# Patient Record
Sex: Female | Born: 1965 | Race: White | Hispanic: No | Marital: Married | Smoking: Never smoker
Health system: Southern US, Community
[De-identification: ages and names within clinical notes are randomized; demographics above are authoritative.]

---

## 1999-10-21 ENCOUNTER — Emergency Department (HOSPITAL_COMMUNITY): Admission: EM | Admit: 1999-10-21 | Discharge: 1999-10-21 | Payer: Self-pay | Admitting: Emergency Medicine

## 1999-12-19 ENCOUNTER — Other Ambulatory Visit: Admission: RE | Admit: 1999-12-19 | Discharge: 1999-12-19 | Payer: Self-pay | Admitting: Family Medicine

## 2001-02-04 ENCOUNTER — Other Ambulatory Visit: Admission: RE | Admit: 2001-02-04 | Discharge: 2001-02-04 | Payer: Self-pay | Admitting: Family Medicine

## 2002-02-03 ENCOUNTER — Other Ambulatory Visit: Admission: RE | Admit: 2002-02-03 | Discharge: 2002-02-03 | Payer: Self-pay | Admitting: *Deleted

## 2002-11-24 HISTORY — PX: ARTHROSCOPIC REPAIR ACL: SUR80

## 2003-05-12 ENCOUNTER — Other Ambulatory Visit: Admission: RE | Admit: 2003-05-12 | Discharge: 2003-05-12 | Payer: Self-pay | Admitting: Obstetrics & Gynecology

## 2003-09-21 ENCOUNTER — Encounter: Admission: RE | Admit: 2003-09-21 | Discharge: 2003-09-21 | Payer: Self-pay | Admitting: Obstetrics & Gynecology

## 2007-04-08 ENCOUNTER — Encounter: Admission: RE | Admit: 2007-04-08 | Discharge: 2007-04-08 | Payer: Self-pay | Admitting: Family Medicine

## 2007-10-20 ENCOUNTER — Encounter: Admission: RE | Admit: 2007-10-20 | Discharge: 2007-10-20 | Payer: Self-pay | Admitting: Obstetrics & Gynecology

## 2007-10-27 ENCOUNTER — Encounter: Admission: RE | Admit: 2007-10-27 | Discharge: 2007-10-27 | Payer: Self-pay | Admitting: Obstetrics & Gynecology

## 2008-04-27 ENCOUNTER — Encounter: Admission: RE | Admit: 2008-04-27 | Discharge: 2008-04-27 | Payer: Self-pay | Admitting: Obstetrics & Gynecology

## 2008-07-20 ENCOUNTER — Encounter: Admission: RE | Admit: 2008-07-20 | Discharge: 2008-07-20 | Payer: Self-pay | Admitting: Chiropractic Medicine

## 2008-10-25 ENCOUNTER — Encounter: Admission: RE | Admit: 2008-10-25 | Discharge: 2008-10-25 | Payer: Self-pay | Admitting: Obstetrics & Gynecology

## 2009-09-01 IMAGING — CR DG LUMBAR SPINE 1V
1 series · 1 of 1 positions shown · non-contrast
Comparison: None

CLINICAL DATA: Low back pain

LUMBAR SPINE - 1 VIEW

[w l-spine lat *]
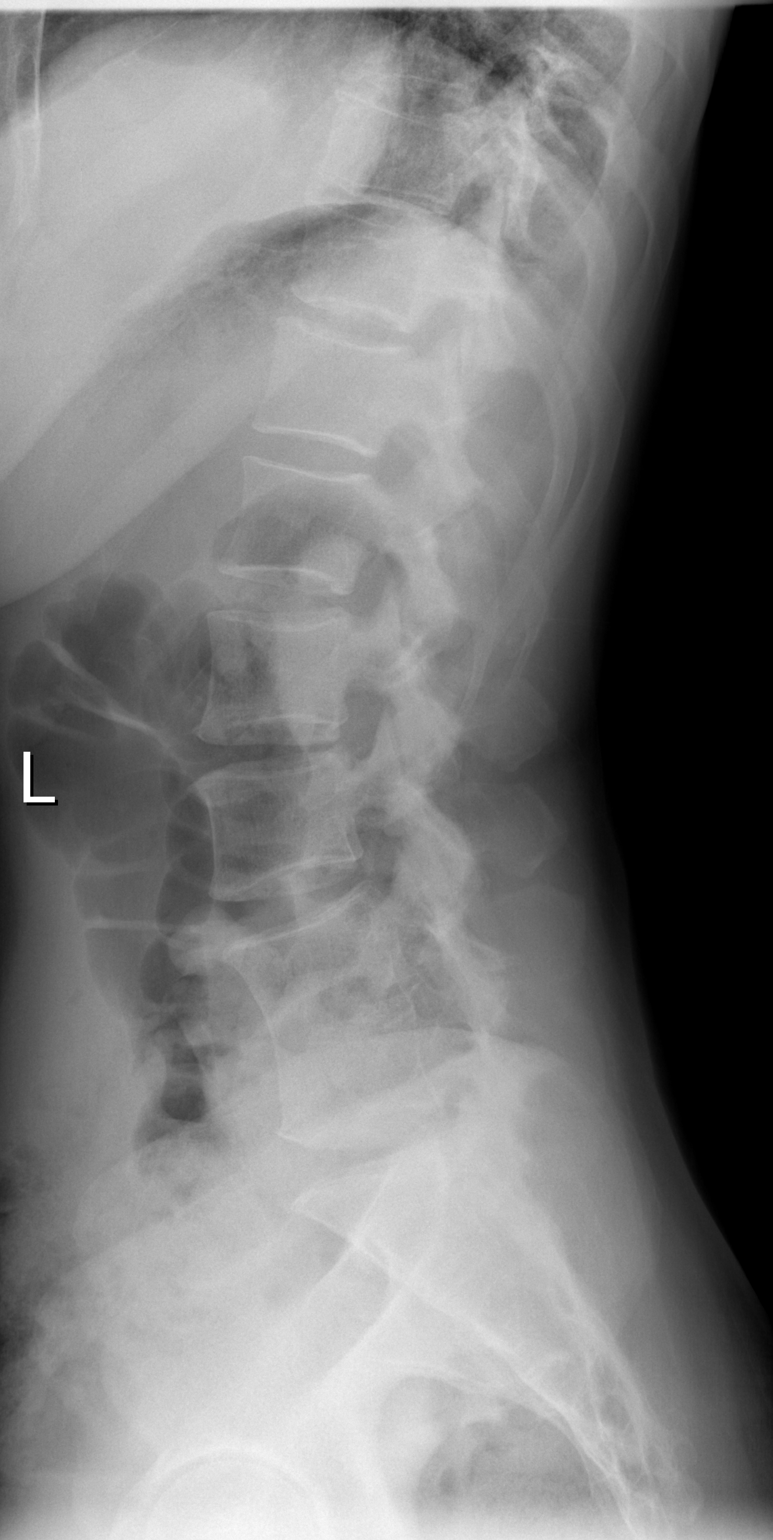

[1 of 1 positions shown; findings below may reference images not displayed]

FINDINGS: Only a single lateral projection was obtained.  There is
fusion of L4 and L5 vertebral bodies which appears to be
congenital.  Otherwise no other bony abnormalities or significant
degenerative changes identified.
IMPRESSION: Fused L4 and L5 vertebral bodies.  Likely congenital.

## 2009-09-01 IMAGING — CR DG PELVIS 1-2V
1 series · 1 of 1 positions shown · non-contrast
Comparison: None

CLINICAL DATA: Pelvic pain

PELVIS - 1-2 VIEW

[w pelvis *]
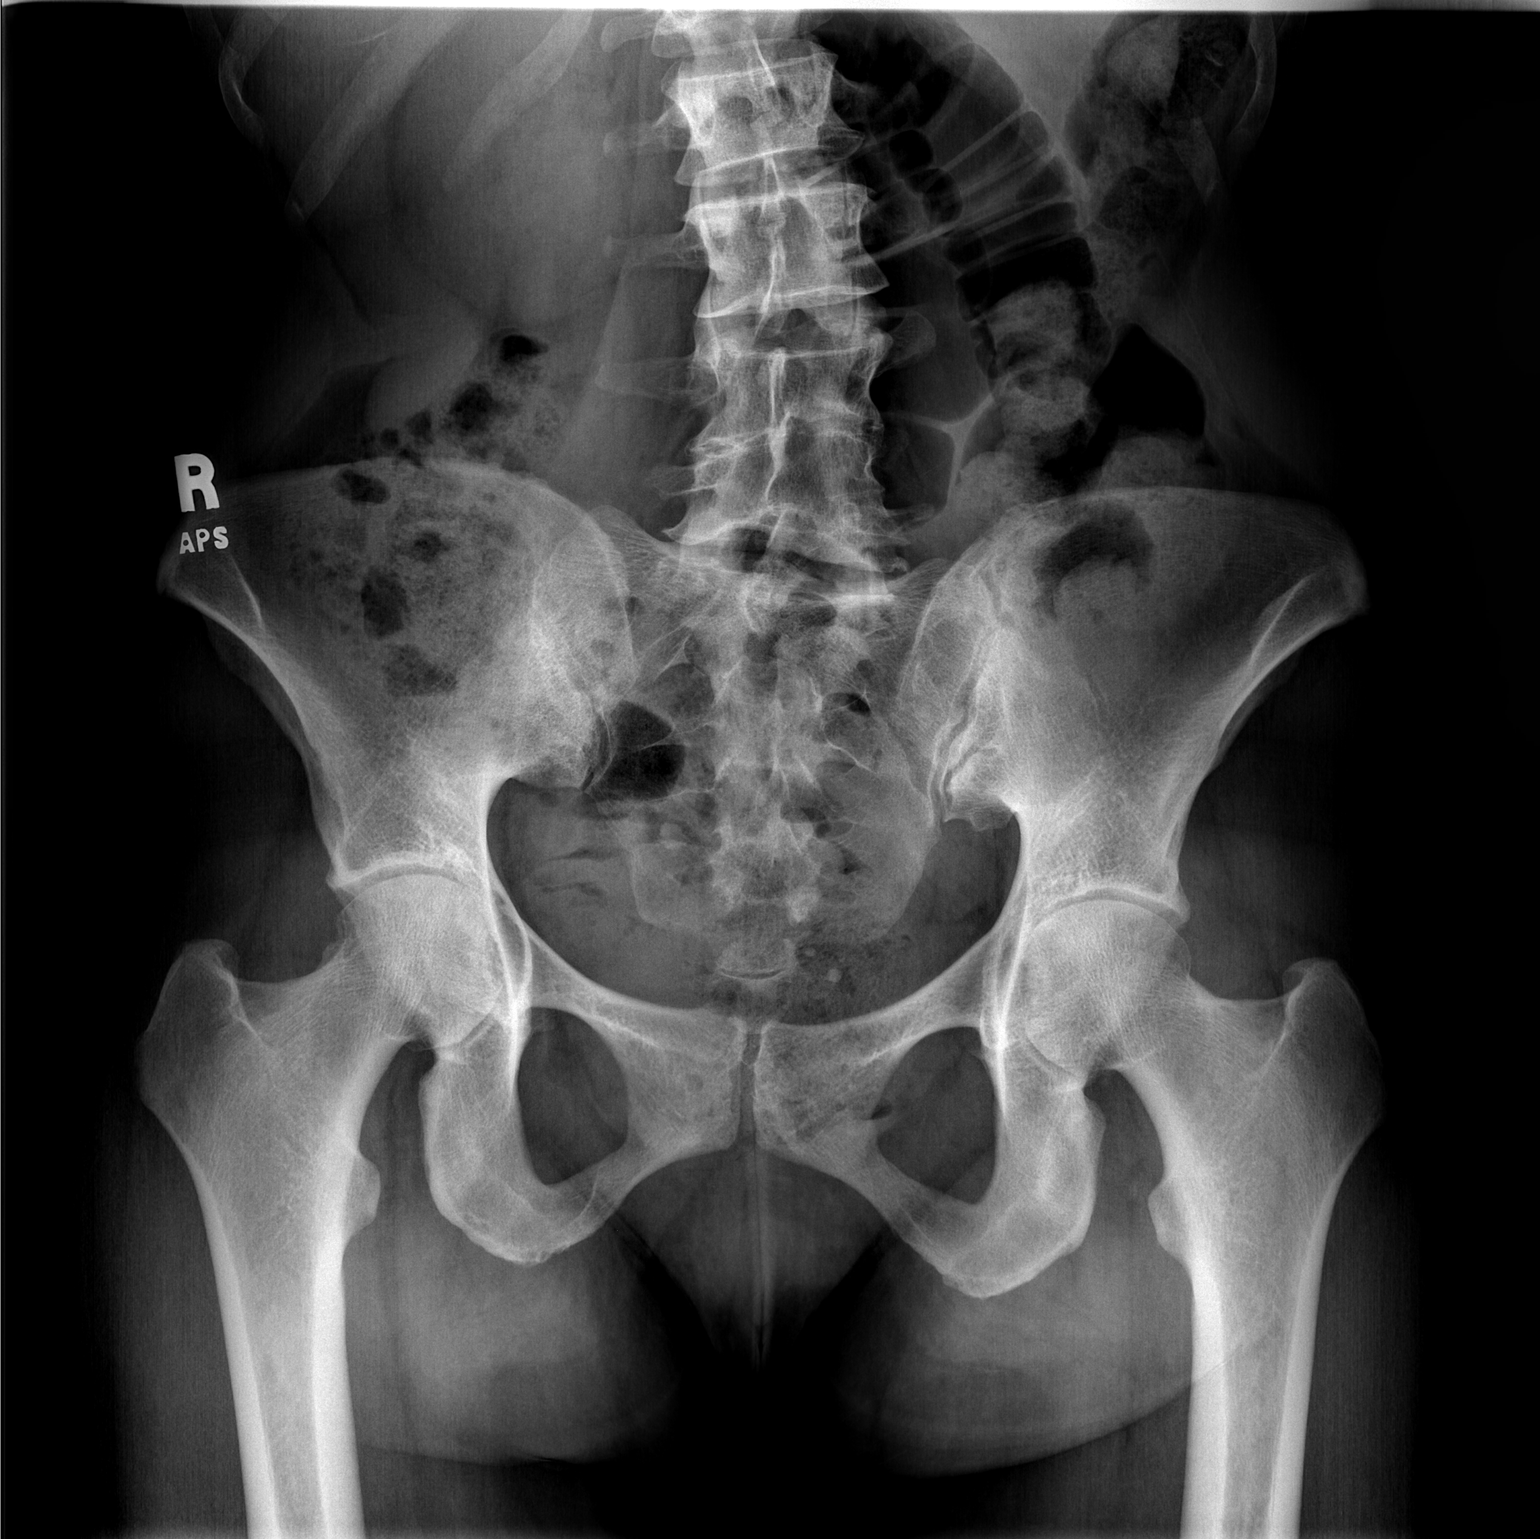

[1 of 1 positions shown; findings below may reference images not displayed]

FINDINGS: In the frontal projection there is fusion of L4-L5 as
noted on the lateral lumbar spine film.  There also is a leftward
convex scoliosis.  The rest of the bony pelvis shows no specific
abnormality.  There is mild gaseous prominence of a single left
lower small bowel loop which may be indicative of ileus.
IMPRESSION: No focal pelvic abnormality.  Levoconvex scoliosis of lumbar spine
and nonspecific gaseous dilatation of a left lower small bowel loop
which may be indicative of ileus.

## 2009-11-07 ENCOUNTER — Encounter: Admission: RE | Admit: 2009-11-07 | Discharge: 2009-11-07 | Payer: Self-pay | Admitting: Obstetrics & Gynecology

## 2011-01-15 ENCOUNTER — Other Ambulatory Visit: Payer: Self-pay | Admitting: Obstetrics & Gynecology

## 2011-01-15 DIAGNOSIS — Z1231 Encounter for screening mammogram for malignant neoplasm of breast: Secondary | ICD-10-CM

## 2011-01-21 ENCOUNTER — Ambulatory Visit
Admission: RE | Admit: 2011-01-21 | Discharge: 2011-01-21 | Disposition: A | Discharge status: Home / Self Care | Payer: 59 | Source: Ambulatory Visit | Attending: Obstetrics & Gynecology | Admitting: Obstetrics & Gynecology

## 2011-01-21 DIAGNOSIS — Z1231 Encounter for screening mammogram for malignant neoplasm of breast: Secondary | ICD-10-CM

## 2011-09-17 ENCOUNTER — Emergency Department (HOSPITAL_COMMUNITY): Payer: 59

## 2011-09-17 ENCOUNTER — Emergency Department (HOSPITAL_COMMUNITY)
Admission: EM | Admit: 2011-09-17 | Discharge: 2011-09-17 | Disposition: A | Discharge status: Home / Self Care | Payer: 59 | Attending: Emergency Medicine | Admitting: Emergency Medicine

## 2011-09-17 ENCOUNTER — Encounter (HOSPITAL_COMMUNITY): Payer: Self-pay

## 2011-09-17 DIAGNOSIS — I10 Essential (primary) hypertension: Secondary | ICD-10-CM | POA: Insufficient documentation

## 2011-09-17 DIAGNOSIS — R350 Frequency of micturition: Secondary | ICD-10-CM | POA: Insufficient documentation

## 2011-09-17 DIAGNOSIS — R5381 Other malaise: Secondary | ICD-10-CM | POA: Insufficient documentation

## 2011-09-17 DIAGNOSIS — R5383 Other fatigue: Secondary | ICD-10-CM | POA: Insufficient documentation

## 2011-09-17 DIAGNOSIS — R51 Headache: Secondary | ICD-10-CM | POA: Insufficient documentation

## 2011-09-17 LAB — URINE MICROSCOPIC-ADD ON

## 2011-09-17 LAB — URINALYSIS, ROUTINE W REFLEX MICROSCOPIC
Bilirubin Urine: NEGATIVE
Glucose, UA: NEGATIVE mg/dL
Ketones, ur: 15 mg/dL — AB
Nitrite: NEGATIVE
Protein, ur: NEGATIVE mg/dL
Specific Gravity, Urine: 1.006 (ref 1.005–1.030)
Urobilinogen, UA: 0.2 mg/dL (ref 0.0–1.0)
pH: 7 (ref 5.0–8.0)

## 2011-09-17 LAB — POCT I-STAT, CHEM 8
BUN: 10 mg/dL (ref 6–23)
Calcium, Ion: 1.16 mmol/L (ref 1.12–1.32)
Chloride: 102 mEq/L (ref 96–112)
Creatinine, Ser: 0.7 mg/dL (ref 0.50–1.10)
Glucose, Bld: 90 mg/dL (ref 70–99)
HCT: 42 % (ref 36.0–46.0)
Hemoglobin: 14.3 g/dL (ref 12.0–15.0)
Potassium: 3.6 mEq/L (ref 3.5–5.1)
Sodium: 139 mEq/L (ref 135–145)
TCO2: 24 mmol/L (ref 0–100)

## 2011-09-17 LAB — POCT PREGNANCY, URINE: Preg Test, Ur: NEGATIVE

## 2012-02-04 ENCOUNTER — Ambulatory Visit: Payer: 59

## 2012-02-04 ENCOUNTER — Ambulatory Visit (INDEPENDENT_AMBULATORY_CARE_PROVIDER_SITE_OTHER): Payer: 59 | Admitting: Emergency Medicine

## 2012-02-04 VITALS — BP 126/78 | HR 60 | Temp 98.4°F | Resp 18 | Ht 67.0 in | Wt 142.0 lb

## 2012-02-04 DIAGNOSIS — W57XXXA Bitten or stung by nonvenomous insect and other nonvenomous arthropods, initial encounter: Secondary | ICD-10-CM

## 2012-02-04 DIAGNOSIS — I10 Essential (primary) hypertension: Secondary | ICD-10-CM

## 2012-02-04 DIAGNOSIS — J479 Bronchiectasis, uncomplicated: Secondary | ICD-10-CM

## 2012-02-04 DIAGNOSIS — S40269A Insect bite (nonvenomous) of unspecified shoulder, initial encounter: Secondary | ICD-10-CM

## 2012-02-04 DIAGNOSIS — R05 Cough: Secondary | ICD-10-CM

## 2012-02-04 DIAGNOSIS — J45909 Unspecified asthma, uncomplicated: Secondary | ICD-10-CM

## 2012-02-04 DIAGNOSIS — J40 Bronchitis, not specified as acute or chronic: Secondary | ICD-10-CM

## 2012-02-04 LAB — POCT CBC
Granulocyte percent: 69.3 %G (ref 37–80)
HCT, POC: 40.4 % (ref 37.7–47.9)
Hemoglobin: 13.1 g/dL (ref 12.2–16.2)
Lymph, poc: 1.6 (ref 0.6–3.4)
MCH, POC: 28.4 pg (ref 27–31.2)
MCHC: 32.4 g/dL (ref 31.8–35.4)
MCV: 87.4 fL (ref 80–97)
MID (cbc): 0.5 (ref 0–0.9)
MPV: 9.5 fL (ref 0–99.8)
POC Granulocyte: 4.7 (ref 2–6.9)
POC LYMPH PERCENT: 22.9 %L (ref 10–50)
POC MID %: 7.8 %M (ref 0–12)
Platelet Count, POC: 234 10*3/uL (ref 142–424)
RBC: 4.62 M/uL (ref 4.04–5.48)
RDW, POC: 13.8 %
WBC: 6.8 10*3/uL (ref 4.6–10.2)

## 2012-02-04 MED ORDER — LOSARTAN POTASSIUM 50 MG PO TABS: 25.0000 mg | ORAL_TABLET | Freq: Every day | ORAL | Status: AC

## 2012-02-04 MED ORDER — BUDESONIDE-FORMOTEROL FUMARATE 80-4.5 MCG/ACT IN AERO: 2.0000 | INHALATION_SPRAY | Freq: Two times a day (BID) | RESPIRATORY_TRACT | Status: AC

## 2012-02-04 MED ORDER — ALBUTEROL SULFATE (2.5 MG/3ML) 0.083% IN NEBU
2.5000 mg | INHALATION_SOLUTION | Freq: Once | RESPIRATORY_TRACT | Status: AC
  Administered 2012-02-04: 2.5 mg via RESPIRATORY_TRACT

## 2012-02-04 MED ORDER — DOXYCYCLINE HYCLATE 100 MG PO TABS: 100.0000 mg | ORAL_TABLET | Freq: Two times a day (BID) | ORAL | Status: AC

## 2012-02-04 NOTE — Patient Instructions (Addendum)
Would advise you to stop your lotensin needed for blood pressure control. Will change it to losartan which does not have the side effect of cough.Asthma, Adult Asthma is caused by narrowing of the air passages in the lungs. It may be triggered by pollen, dust, animal dander, molds, some foods, respiratory infections, exposure to smoke, exercise, emotional stress or other allergens (things that cause allergic reactions or allergies). Repeat attacks are common. HOME CARE INSTRUCTIONS   Use prescription medications as ordered by your caregiver.   Avoid pollen, dust, animal dander, molds, smoke and other things that cause attacks at home and at work.   You may have fewer attacks if you decrease dust in your home. Electrostatic air cleaners may help.   It may help to replace your pillows or mattress with materials less likely to cause allergies.   Talk to your caregiver about an action plan for managing asthma attacks at home, including, the use of a peak flow meter which measures the severity of your asthma attack. An action plan can help minimize or stop the attack without having to seek medical care.   If you are not on a fluid restriction, drink 8 to 10 glasses of water each day.   Always have a plan prepared for seeking medical attention, including, calling your physician, accessing local emergency care, and calling 911 (in the [place] for a severe attack.   Discuss possible exercise routines with your caregiver.   If animal dander is the cause of asthma, you may need to get rid of pets.  SEEK MEDICAL CARE IF:   You have wheezing and shortness of breath even if taking medicine to prevent attacks.   You have muscle aches, chest pain or thickening of sputum.   Your sputum changes from clear or white to yellow, green, gray, or bloody.   You have any problems that may be related to the medicine you are taking (such as a rash, itching, swelling or trouble breathing).  SEEK IMMEDIATE MEDICAL  CARE IF:   Your usual medicines do not stop your wheezing or there is increased coughing and/or shortness of breath.   You have increased difficulty breathing.   You have a fever.  MAKE SURE YOU:   Understand these instructions.   Will watch your condition.   Will get help right away if you are not doing well or get worse.  Document Released: 11/10/2005 Document Revised: 10/30/2011 Document Reviewed: 06/28/2008 [facility] Patient Information 2012 [place], [place].

## 2012-02-04 NOTE — Progress Notes (Signed)
  Subjective:    Patient ID: Renee Buckley, female    DOB: 09/26/1966, 46 y.o.   MRN: [id number]  HPI patient here with a history of being ill since December. Patient apparently had a flulike illness at that time and since has been bothered with a nagging-type cough. Her cough is associated with wheezing. She has a history of allergies. She has been using her inhaler regular. It is also interesting that in December she was started on an ACE inhibitor for blood pressure control.    Review of Systems patient is having chest discomfort she feels tight in her chest she has significant wheezing. She is a nonsmoker.     Objective:   Physical Exam  Constitutional: She appears well-developed and well-nourished.  HENT:  Head: Normocephalic and atraumatic.  Eyes: Pupils are equal, round, and reactive to light. Right eye exhibits no discharge.  Neck: No JVD present. No tracheal deviation present. No thyromegaly present.  Cardiovascular: Normal rate, regular rhythm, normal heart sounds and intact distal pulses.  Exam reveals no gallop and no friction rub.   No murmur heard. Pulmonary/Chest: Effort normal. She has wheezes. She has no rales. She exhibits no tenderness.  Lymphadenopathy:    She has no cervical adenopathy.    [facility] reading (PRIMARY) by  Dr.Aedon Deason chest x-ray shows no acute disease.  Patient stated she felt a tick on her shoulder when I went back in the exam room this was cleaned with alcohol and removed with forceps patient tolerated well.     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 Some of her cough may be related to her ACE inhibitor. Plan on stopping her blood pressure pill changed to valsartan. Get a treatment check chest x-ray CBC and further management after that. She may benefit from a long-acting Advair or Symbicort.

## 2012-02-06 ENCOUNTER — Telehealth: Payer: Self-pay

## 2012-02-06 NOTE — Telephone Encounter (Signed)
Pt states that the antibodics that she was given is making her sick and would like to talk with someone

## 2012-02-06 NOTE — Telephone Encounter (Signed)
Doxy should be taken with food but not milk products (it causes it not to be absorbed).  Because of tick bite that is probably the best med for you to be one.  If still cannot tolerate it with a full stomach - let me know.

## 2012-02-06 NOTE — Telephone Encounter (Signed)
Spoke with Alfa states she has been very nauseated since she started the Doxy and Losartan.  She did not eat prior to taking Doxy but does eat prior to taking Losartan. She did not take Doxy today and is feeling better.  The directions state to take Doxy on empty stomach.  She did try to take it with milk with no improvement.  Also, she had just finished taking a Z-pack on Monday.

## 2012-02-07 NOTE — Telephone Encounter (Signed)
Left message advising that she should try taking med with full stomach and not with milk products. Also, said due to tick bite recommended doxy being best med for this. To CB if after trying with full stomach to please call us back and see what our other options could be.

## 2012-03-22 ENCOUNTER — Other Ambulatory Visit: Payer: Self-pay | Admitting: Obstetrics & Gynecology

## 2012-03-22 DIAGNOSIS — Z1231 Encounter for screening mammogram for malignant neoplasm of breast: Secondary | ICD-10-CM

## 2012-04-13 ENCOUNTER — Ambulatory Visit
Admission: RE | Admit: 2012-04-13 | Discharge: 2012-04-13 | Disposition: A | Discharge status: Home / Self Care | Payer: 59 | Source: Ambulatory Visit | Attending: Obstetrics & Gynecology | Admitting: Obstetrics & Gynecology

## 2012-04-13 DIAGNOSIS — Z1231 Encounter for screening mammogram for malignant neoplasm of breast: Secondary | ICD-10-CM

## 2012-08-12 ENCOUNTER — Other Ambulatory Visit: Payer: Self-pay | Admitting: Internal Medicine

## 2012-08-12 DIAGNOSIS — R413 Other amnesia: Secondary | ICD-10-CM

## 2012-08-13 ENCOUNTER — Ambulatory Visit
Admission: RE | Admit: 2012-08-13 | Discharge: 2012-08-13 | Disposition: A | Discharge status: Home / Self Care | Payer: 59 | Source: Ambulatory Visit | Attending: Internal Medicine | Admitting: Internal Medicine

## 2012-08-13 DIAGNOSIS — R413 Other amnesia: Secondary | ICD-10-CM

## 2012-10-29 IMAGING — CT CT HEAD W/O CM
1 of 2 series · 13 of 30 positions shown, 17 images · non-contrast
Comparison: None.

CLINICAL DATA: Hypertension.  Weakness and headache.  Blurred
vision and dizziness.

CT HEAD WITHOUT CONTRAST
TECHNIQUE: Contiguous axial images were obtained from the base of
the skull through the vertex without contrast.

[Series 2: brain · axial · 0.47mm/px · z∈[+124,+256]mm · 13 of 32 slices shown, 17 images]
[im 3/32  brain]
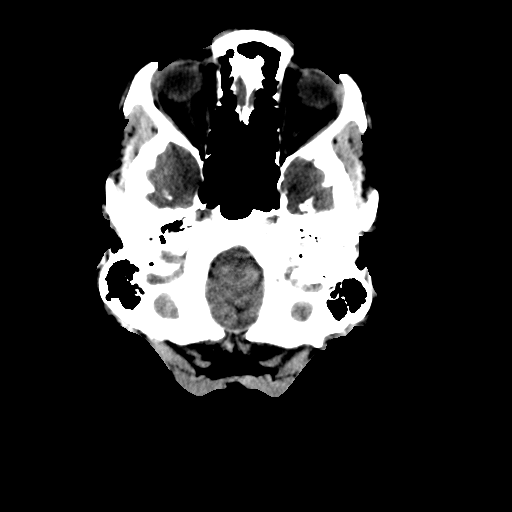
[im 3/32  bone]
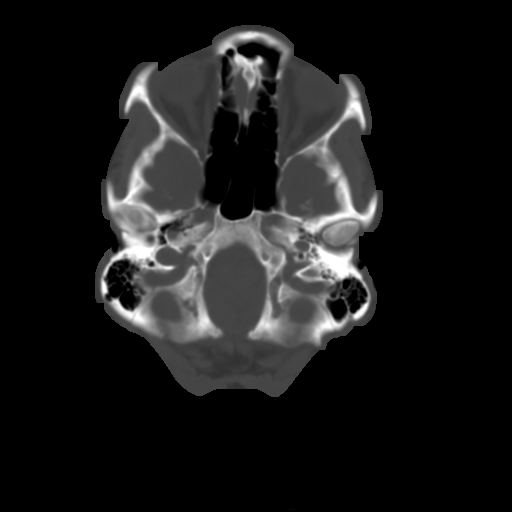
[im 5/32  brain]
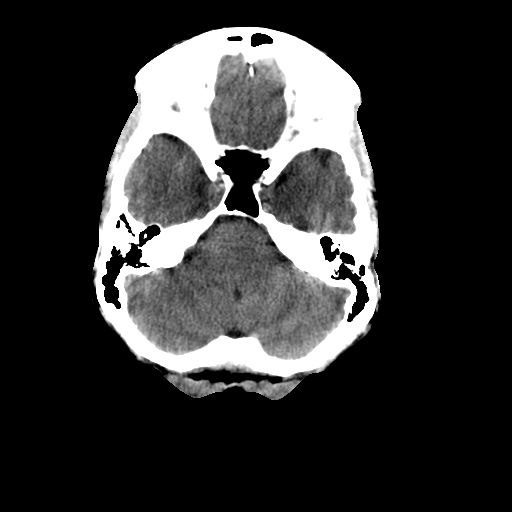
[im 7/32  brain]
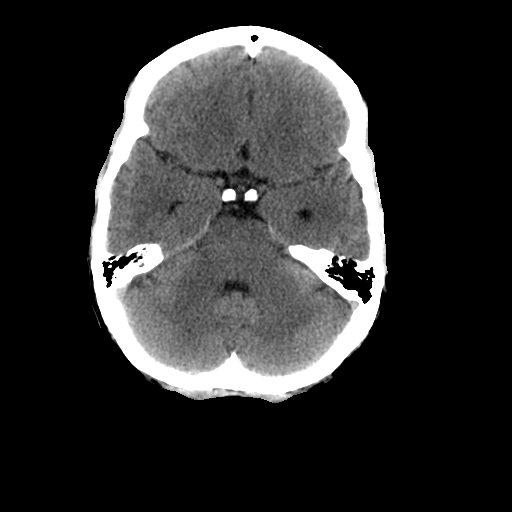
[im 9/32  brain]
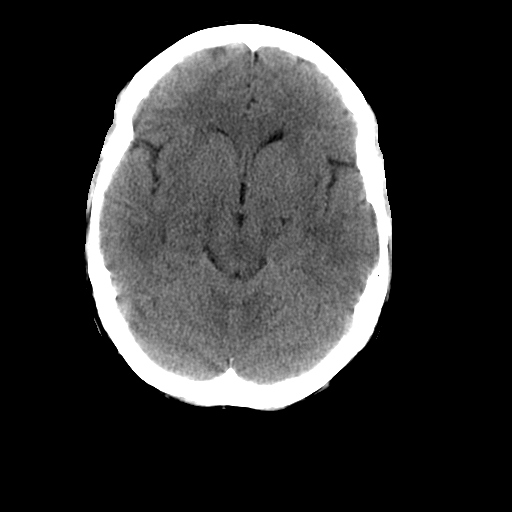
[im 12/32  brain]
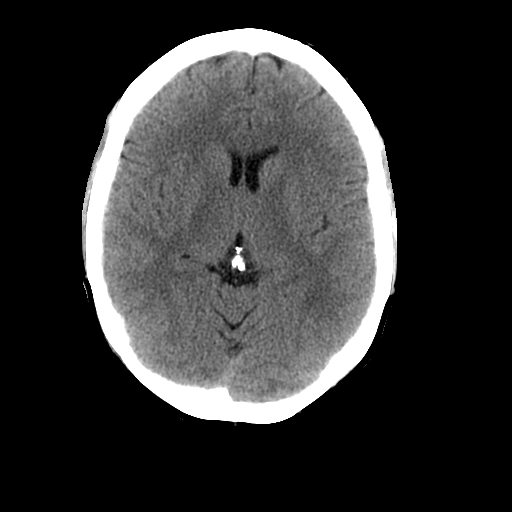
[im 12/32  bone]
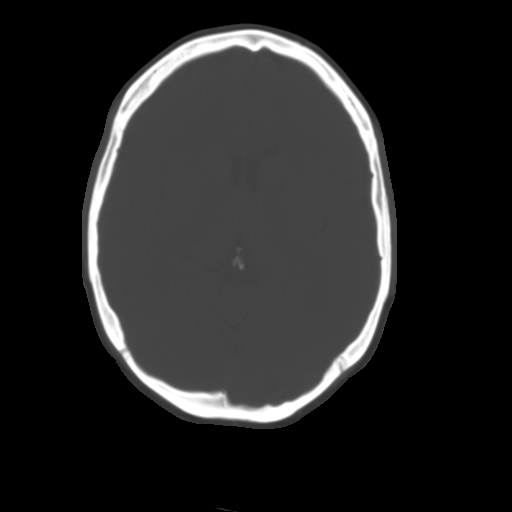
[im 14/32  brain]
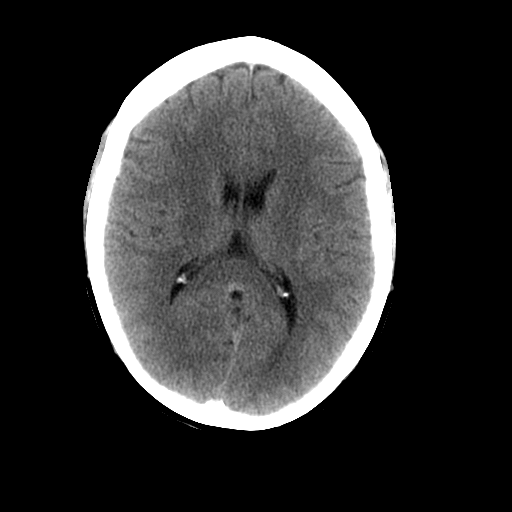
[im 16/32  brain]
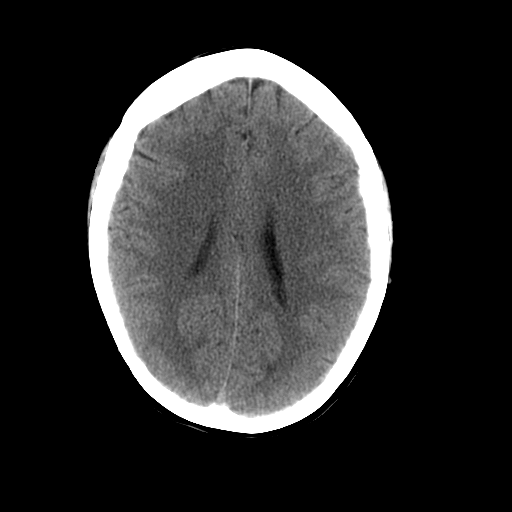
[im 18/32  brain]
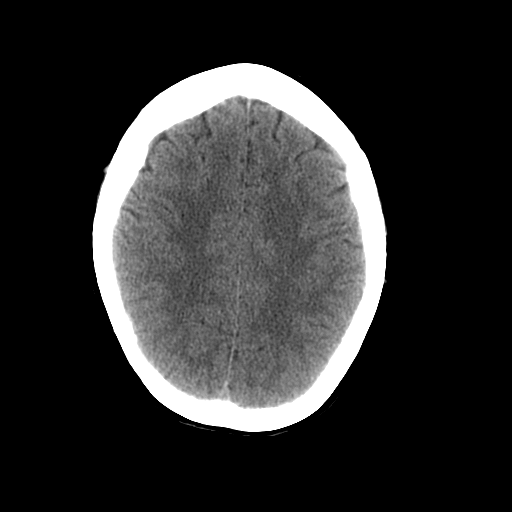
[im 20/32  brain]
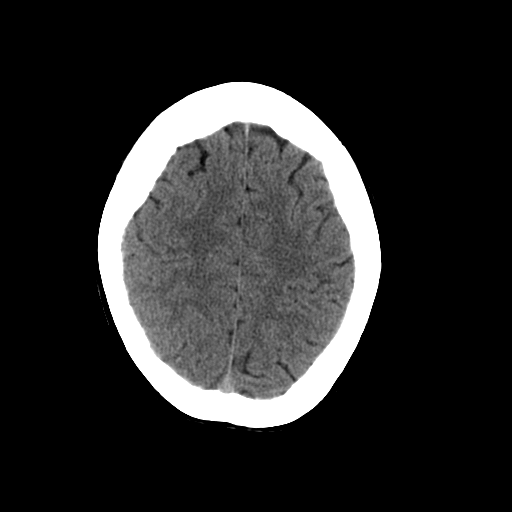
[im 20/32  bone]
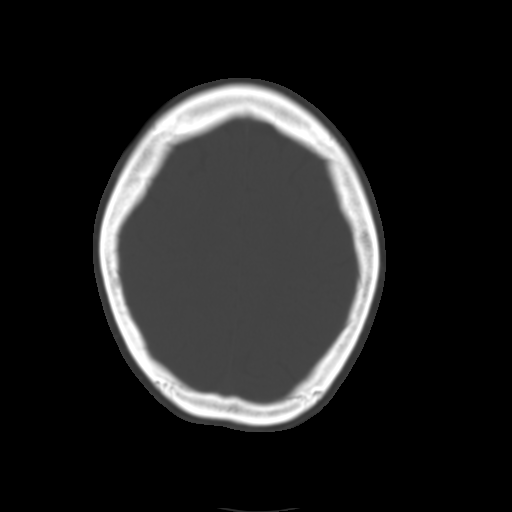
[im 23/32  brain]
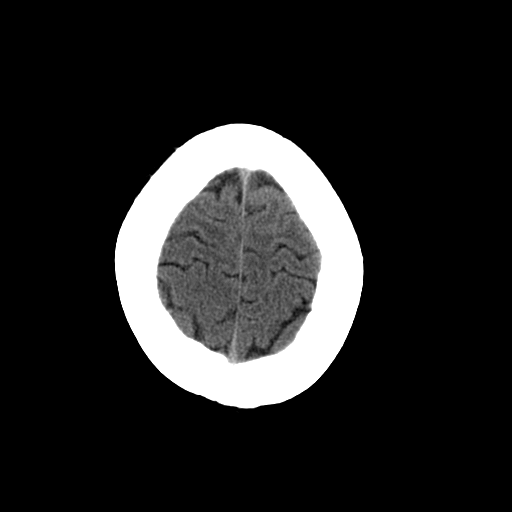
[im 25/32  brain]
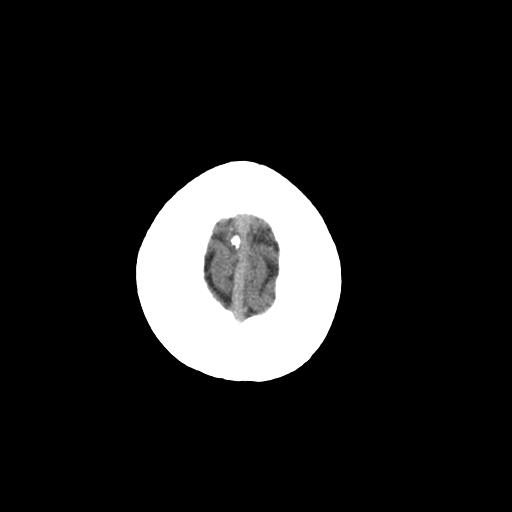
[im 27/32  brain]
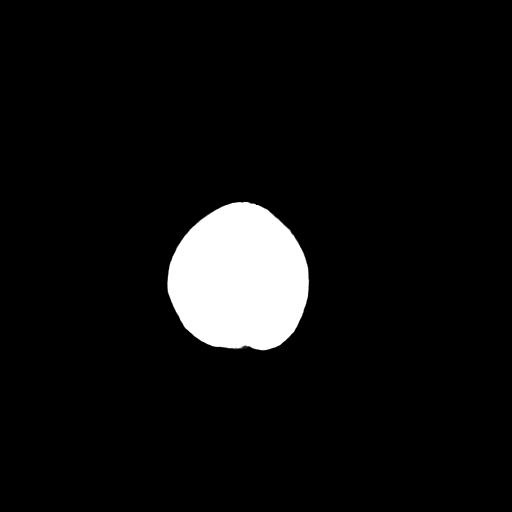
[im 29/32  brain]
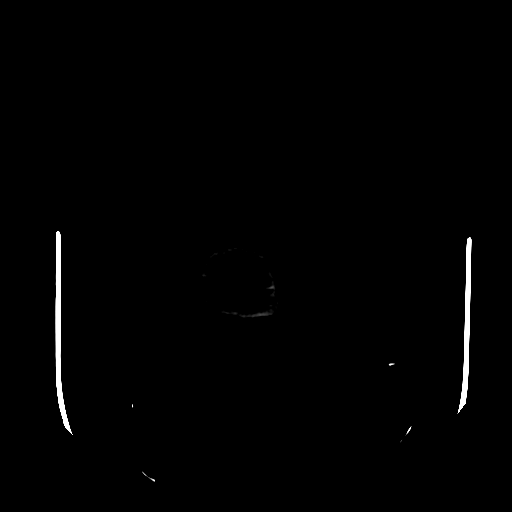
[im 29/32  bone]
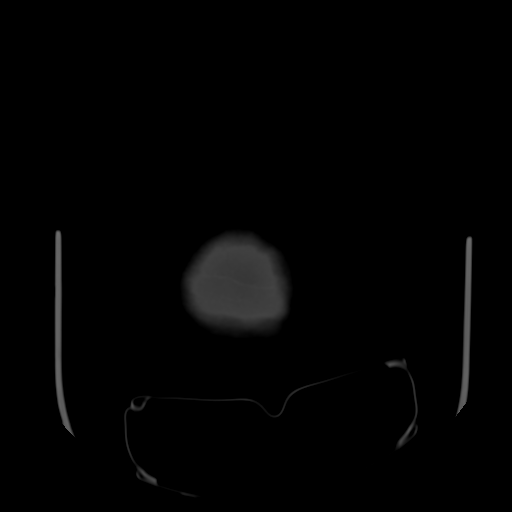

[13 of 30 positions shown; findings below may reference images not displayed]

FINDINGS: No evidence of acute infarct, acute hemorrhage, mass
lesion, mass effect or hydrocephalus.  Visualized portions of the
paranasal sinuses and mastoid air cells are clear.
IMPRESSION: Negative.

## 2013-03-28 ENCOUNTER — Other Ambulatory Visit: Payer: Self-pay

## 2013-03-28 DIAGNOSIS — Z1231 Encounter for screening mammogram for malignant neoplasm of breast: Secondary | ICD-10-CM

## 2013-05-05 ENCOUNTER — Ambulatory Visit
Admission: RE | Admit: 2013-05-05 | Discharge: 2013-05-05 | Disposition: A | Discharge status: Home / Self Care | Payer: 59 | Source: Ambulatory Visit

## 2013-05-05 DIAGNOSIS — Z1231 Encounter for screening mammogram for malignant neoplasm of breast: Secondary | ICD-10-CM

## 2013-12-07 ENCOUNTER — Other Ambulatory Visit: Payer: Self-pay | Admitting: Internal Medicine

## 2013-12-07 ENCOUNTER — Ambulatory Visit
Admission: RE | Admit: 2013-12-07 | Discharge: 2013-12-07 | Disposition: A | Discharge status: Home / Self Care | Payer: 59 | Source: Ambulatory Visit | Attending: Internal Medicine | Admitting: Internal Medicine

## 2013-12-07 DIAGNOSIS — M792 Neuralgia and neuritis, unspecified: Secondary | ICD-10-CM

## 2013-12-19 ENCOUNTER — Ambulatory Visit
Admission: RE | Admit: 2013-12-19 | Discharge: 2013-12-19 | Disposition: A | Discharge status: Home / Self Care | Payer: 59 | Source: Ambulatory Visit | Attending: Internal Medicine | Admitting: Internal Medicine

## 2013-12-19 ENCOUNTER — Other Ambulatory Visit: Payer: Self-pay | Admitting: Internal Medicine

## 2013-12-19 DIAGNOSIS — R05 Cough: Secondary | ICD-10-CM

## 2013-12-19 DIAGNOSIS — R059 Cough, unspecified: Secondary | ICD-10-CM

## 2014-09-06 ENCOUNTER — Other Ambulatory Visit: Payer: Self-pay

## 2014-09-06 DIAGNOSIS — Z1239 Encounter for other screening for malignant neoplasm of breast: Secondary | ICD-10-CM

## 2014-09-27 ENCOUNTER — Ambulatory Visit
Admission: RE | Admit: 2014-09-27 | Discharge: 2014-09-27 | Disposition: A | Discharge status: Home / Self Care | Payer: 59 | Source: Ambulatory Visit

## 2014-09-27 DIAGNOSIS — Z1239 Encounter for other screening for malignant neoplasm of breast: Secondary | ICD-10-CM

## 2015-11-29 ENCOUNTER — Other Ambulatory Visit: Payer: Self-pay

## 2015-11-29 DIAGNOSIS — Z1231 Encounter for screening mammogram for malignant neoplasm of breast: Secondary | ICD-10-CM

## 2015-12-13 ENCOUNTER — Ambulatory Visit
Admission: RE | Admit: 2015-12-13 | Discharge: 2015-12-13 | Disposition: A | Discharge status: Home / Self Care | Payer: Commercial Managed Care - HMO | Source: Ambulatory Visit

## 2015-12-13 DIAGNOSIS — Z1231 Encounter for screening mammogram for malignant neoplasm of breast: Secondary | ICD-10-CM

## 2017-03-04 ENCOUNTER — Other Ambulatory Visit: Payer: Self-pay | Admitting: Obstetrics and Gynecology

## 2017-03-04 DIAGNOSIS — Z1231 Encounter for screening mammogram for malignant neoplasm of breast: Secondary | ICD-10-CM

## 2017-03-26 ENCOUNTER — Ambulatory Visit
Admission: RE | Admit: 2017-03-26 | Discharge: 2017-03-26 | Disposition: A | Discharge status: Home / Self Care | Payer: BC Managed Care – PPO | Source: Ambulatory Visit | Attending: Obstetrics and Gynecology | Admitting: Obstetrics and Gynecology

## 2017-03-26 DIAGNOSIS — Z1231 Encounter for screening mammogram for malignant neoplasm of breast: Secondary | ICD-10-CM

## 2017-04-30 ENCOUNTER — Encounter: Payer: Self-pay | Admitting: Neurology

## 2017-04-30 ENCOUNTER — Ambulatory Visit (INDEPENDENT_AMBULATORY_CARE_PROVIDER_SITE_OTHER): Payer: BC Managed Care – PPO | Admitting: Neurology

## 2017-04-30 ENCOUNTER — Encounter (INDEPENDENT_AMBULATORY_CARE_PROVIDER_SITE_OTHER): Payer: Self-pay

## 2017-04-30 ENCOUNTER — Telehealth: Payer: Self-pay | Admitting: Neurology

## 2017-04-30 VITALS — BP 131/77 | HR 60 | Ht 68.0 in | Wt 137.8 lb

## 2017-04-30 DIAGNOSIS — H539 Unspecified visual disturbance: Secondary | ICD-10-CM | POA: Diagnosis not present

## 2017-04-30 DIAGNOSIS — H547 Unspecified visual loss: Secondary | ICD-10-CM | POA: Diagnosis not present

## 2017-04-30 DIAGNOSIS — I6349 Cerebral infarction due to embolism of other cerebral artery: Secondary | ICD-10-CM | POA: Diagnosis not present

## 2017-04-30 DIAGNOSIS — I672 Cerebral atherosclerosis: Secondary | ICD-10-CM | POA: Diagnosis not present

## 2017-04-30 DIAGNOSIS — I639 Cerebral infarction, unspecified: Secondary | ICD-10-CM | POA: Diagnosis not present

## 2017-04-30 DIAGNOSIS — I670 Dissection of cerebral arteries, nonruptured: Secondary | ICD-10-CM

## 2017-04-30 DIAGNOSIS — G43109 Migraine with aura, not intractable, without status migrainosus: Secondary | ICD-10-CM | POA: Diagnosis not present

## 2017-04-30 NOTE — Progress Notes (Signed)
[facility]    Provider:  Dr Lucia Gaskins Referring Provider: Ernesto Rutherford, MD Primary Care Physician:  Burton Apley, MD  CC:  Transient vision loss  HPI:  Renee Buckley is a 51 y.o. female here as a referral from Dr. Dione Booze for transient vision loss. PMHx HTN. She has been here for her dad's dementia. 5 years ago she was at work and all of sudden she could only see half a word. Both eyes affected. She could only see "OP STOP sign". Lasted 20 minutes, no headache, no facial droop, no weakness but difficulty getting words out. It was acute in onset and resolution. No pain. No residual effects. !.5 years ago exactly the same. 3-4 weeks ago  In [place], she was just shopping for groceries, she had a strange feeling and she couldn't see 1/2 the words. Both eyes affected symmetrically. Lasted 20-30 minutes and then completely resolved. She felt confused and anxious about her vision loss. Mother had headaches. No weakness, sensory changes. She has had white bursts in her vision like sparklers years ago. She played sports and had some concussions and neck injuries. No other focal neurologic deficits, associated symptoms, inciting events or modifiable factors. She has had headaches, pounding throbbong, nausea, light sensitivity one lasted over 12 hours but rare. She has SOB on exertion and difficulty with exertion on exercise. No other focal neurologic deficits, associated symptoms, inciting events or modifiable factors.  Reviewed notes, labs and imaging from outside physicians, which showed:  Reviewed notes from Dr. Alden Hipp. This is a patient who presented for comprehensive exam for blurring near vision. She had 3 episodes where she could only see half of the world most recent 2 weeks previous to this appointment which she only saw part of a [employer] sign. Lasted 15-20 minutes and she felt weird. No headache. First episode was 45 years ago, second episode was one and half years ago and then 2  weeks prior to appointment with Dr. Dione Booze. It was always completely lost to the left side and are always came back in about 20 minutes. Reviewed entire exam which was unremarkable including visual fields, visual acuity, external exam including pupils, orbits, Intraocular pressure, slit lamp exam with normal appearing normal appearing optic nerve with healthy pink rim and normal nerve fiber layer, symptoms sound like classical migraine.  CT head 2013 showed No acute intracranial abnormalities including mass lesion or mass effect, hydrocephalus, extra-axial fluid collection, midline shift, hemorrhage, or acute infarction, large ischemic events (personally reviewed images)     Review of Systems: Patient complains of symptoms per HPI as well as the following symptoms: Fatigue, blurred vision, loss of vision, aching muscles, allergies, runny nose, memory loss, headache, numbness, slurred speech, decreased energy. Pertinent negatives and positives per HPI. All others negative.   Social History   Social History  . Marital status: Married    Spouse name: N/A  . Number of children: 0  . Years of education: 12   Occupational History  . Self-employed    Social History Main Topics  . Smoking status: Never Smoker  . Smokeless tobacco: Former Neurosurgeon    Types: Chew    Quit date: 2003  . Alcohol use 1.2 oz/week    2 Standard drinks or equivalent per week  . Drug use: No  . Sexual activity: Not on file   Other Topics Concern  . Not on file   Social History Narrative   Lives at home w/ her husband   Right-handed  Caffeine: 3 cans per day    Family History  Problem Relation Age of Onset  . COPD Mother   . High blood pressure Mother   . Stroke Father     History reviewed. No pertinent past medical history.  Past Surgical History:  Procedure Laterality Date  . ARTHROSCOPIC REPAIR ACL  2004    Current Outpatient Prescriptions  Medication Sig Dispense Refill  . losartan (COZAAR) 100  MG tablet Take by mouth.    Renee Buckley 0.18/0.215/0.25 MG-25 MCG tab      No current facility-administered medications for this visit.     Allergies as of 04/30/2017  . (No Known Allergies)    Vitals: BP 131/77   Pulse 60   Ht 5\' 8"  (1.727 m)   Wt 137 lb 12.8 oz (62.5 kg)   BMI 20.95 kg/m  Last Weight:  Wt Readings from Last 1 Encounters:  04/30/17 137 lb 12.8 oz (62.5 kg)   Last Height:   Ht Readings from Last 1 Encounters:  04/30/17 5\' 8"  (1.727 m)   Physical exam: Exam: Gen: NAD, conversant, well nourised, well groomed                     CV: RRR, no MRG. No Carotid Bruits. No peripheral edema, warm, nontender Eyes: Conjunctivae clear without exudates or hemorrhage  Neuro: Detailed Neurologic Exam  Speech:    Speech is normal; fluent and spontaneous with normal comprehension.  Cognition:    The patient is oriented to person, place, and time;     recent and remote memory intact;     language fluent;     normal attention, concentration,     fund of knowledge Cranial Nerves:    The pupils are equal, round, and reactive to light. The fundi are normal and spontaneous venous pulsations are present. Visual fields are full to finger confrontation. Extraocular movements are intact. Trigeminal sensation is intact and the muscles of mastication are normal. The face is symmetric. The palate elevates in the midline. Hearing intact. Voice is normal. Shoulder shrug is normal. The tongue has normal motion without fasciculations.   Coordination:    Normal finger to nose and heel to shin. Normal rapid alternating movements.   Gait:    Heel-toe and tandem gait are normal.   Motor Observation:    No asymmetry, no atrophy, and no involuntary movements noted. Tone:    Normal muscle tone.    Posture:    Posture is normal. normal erect    Strength:    Strength is V/V in the upper and lower limbs.      Sensation: intact to LT     Reflex Exam:  DTR's:    Deep tendon  reflexes in the upper and lower extremities are normal bilaterally.   Toes:    The toes are downgoing bilaterally.   Clonus:    Clonus is absent.       Assessment/Plan:  51 year old lovely patient with transient vision loss, aphasia given symptoms and vascular risk factors need evaluation for ischemic stroke, embolic stroke including vessel-to-vessel emboli or cardiac emboli. Also want to ensure no dissection. Likely migraine with aura but need to rule out other etiologies as above.  MRI brain to evaluate for strokes MRA of the head to evaluate for cerebrovascular disease and artery to artery embolic etiology MRA of the neck to rule out dissection Echocardiogram to evaluate for cardiac etiologies of embolic stroke Labs, will check routine labs  as well as ldl and hgba1c as these are stroke risk factors  Cc: Dr. Dione BoozeGroat  Orders Placed This Encounter  Procedures  . MR BRAIN WO CONTRAST  . MR MRA HEAD WO CONTRAST  . MR MRA NECK W WO CONTRAST  . Comprehensive metabolic panel  . CBC  . Lipid panel  . Hemoglobin A1c    Cc: Dr. Dione BoozeGroat  To prevent or relieve headaches, try the following: Cool Compress. Lie down and place a cool compress on your head.  Avoid headache triggers. If certain foods or odors seem to have triggered your migraines in the past, avoid them. A headache diary might help you identify triggers.  Include physical activity in your daily routine. Try a daily walk or other moderate aerobic exercise.  Manage stress. Find healthy ways to cope with the stressors, such as delegating tasks on your to-do list.  Practice relaxation techniques. Try deep breathing, yoga, massage and visualization.  Eat regularly. Eating regularly scheduled meals and maintaining a healthy diet might help prevent headaches. Also, drink plenty of fluids.  Follow a regular sleep schedule. Sleep deprivation might contribute to headaches Consider biofeedback. With this mind-body technique, you learn to  control certain bodily functions - such as muscle tension, heart rate and blood pressure - to prevent headaches or reduce headache pain.    Proceed to emergency room if you experience new or worsening symptoms or symptoms do not resolve, if you have new neurologic symptoms or if headache is severe, or for any concerning symptom.   Provided education and documentation from American headache Society toolbox including articles on: chronic migraine medication overuse headache, chronic migraines, prevention of migraines, behavioral and other nonpharmacologic treatments for headache.   Naomie DeanAntonia Azuri Bozard, MD  [facility] [address] [place], [place] [id number]  Phone [phone] Fax [phone]

## 2017-04-30 NOTE — Patient Instructions (Signed)
Remember to drink plenty of fluid, eat healthy meals and do not skip any meals. Try to eat protein with a every meal and eat a healthy snack such as fruit or nuts in between meals. Try to keep a regular sleep-wake schedule and try to exercise daily, particularly in the form of walking, 20-30 minutes a day, if you can.   As far as diagnostic testing: Labs, MRI brain and also MRi of the blood vessels in the head and neck  I would like to see you back as needed, sooner if we need to. Please call us with any interim questions, concerns, problems, updates or refill requests.   Our phone number is [phone]. We also have an after hours call service for urgent matters and there is a physician on-call for urgent questions. For any emergencies you know to call 911 or go to the nearest emergency room   Migraine Headache A migraine headache is an intense, throbbing pain on one side or both sides of the head. Migraines may also cause other symptoms, such as nausea, vomiting, and sensitivity to light and noise. What are the causes? Doing or taking certain things may also trigger migraines, such as:  Alcohol.  Smoking.  Medicines, such as: ? Medicine used to treat chest pain (nitroglycerine). ? Birth control pills. ? Estrogen pills. ? Certain blood pressure medicines.  Aged cheeses, chocolate, or caffeine.  Foods or drinks that contain nitrates, glutamate, aspartame, or tyramine.  Physical activity.  Other things that may trigger a migraine include:  Menstruation.  Pregnancy.  Hunger.  Stress, lack of sleep, too much sleep, or fatigue.  Weather changes.  What increases the risk? The following factors may make you more likely to experience migraine headaches:  Age. Risk increases with age.  Family history of migraine headaches.  Being Caucasian.  Depression and anxiety.  Obesity.  Being a woman.  Having a hole in the heart (patent foramen ovale) or other heart  problems.  What are the signs or symptoms? The main symptom of this condition is pulsating or throbbing pain. Pain may:  Happen in any area of the head, such as on one side or both sides.  Interfere with daily activities.  Get worse with physical activity.  Get worse with exposure to bright lights or loud noises.  Other symptoms may include:  Nausea.  Vomiting.  Dizziness.  General sensitivity to bright lights, loud noises, or smells.  Before you get a migraine, you may get warning signs that a migraine is developing (aura). An aura may include:  Seeing flashing lights or having blind spots.  Seeing bright spots, halos, or zigzag lines.  Having tunnel vision or blurred vision.  Having numbness or a tingling feeling.  Having trouble talking.  Having muscle weakness.  How is this diagnosed? A migraine headache can be diagnosed based on:  Your symptoms.  A physical exam.  Tests, such as CT scan or MRI of the head. These imaging tests can help rule out other causes of headaches.  Taking fluid from the spine (lumbar puncture) and analyzing it (cerebrospinal fluid analysis, or CSF analysis).  How is this treated? A migraine headache is usually treated with medicines that:  Relieve pain.  Relieve nausea.  Prevent migraines from coming back.  Treatment may also include:  Acupuncture.  Lifestyle changes like avoiding foods that trigger migraines.  Follow these instructions at home: Medicines  Take over-the-counter and prescription medicines only as told by your health care provider.  Do  not drive or use heavy machinery while taking prescription pain medicine.  To prevent or treat constipation while you are taking prescription pain medicine, your health care provider may recommend that you: ? Drink enough fluid to keep your urine clear or pale yellow. ? Take over-the-counter or prescription medicines. ? Eat foods that are high in fiber, such as fresh  fruits and vegetables, whole grains, and beans. ? Limit foods that are high in fat and processed sugars, such as fried and sweet foods. Lifestyle  Avoid alcohol use.  Do not use any products that contain nicotine or tobacco, such as cigarettes and e-cigarettes. If you need help quitting, ask your health care provider.  Get at least 8 hours of sleep every night.  Limit your stress. General instructions   Keep a journal to find out what may trigger your migraine headaches. For example, write down: ? What you eat and drink. ? How much sleep you get. ? Any change to your diet or medicines.  If you have a migraine: ? Avoid things that make your symptoms worse, such as bright lights. ? It may help to lie down in a dark, quiet room. ? Do not drive or use heavy machinery. ? Ask your health care provider what activities are safe for you while you are experiencing symptoms.  Keep all follow-up visits as told by your health care provider. This is important. Contact a health care provider if:  You develop symptoms that are different or more severe than your usual migraine symptoms. Get help right away if:  Your migraine becomes severe.  You have a fever.  You have a stiff neck.  You have vision loss.  Your muscles feel weak or like you cannot control them.  You start to lose your balance often.  You develop trouble walking.  You faint. This information is not intended to replace advice given to you by your health care provider. Make sure you discuss any questions you have with your health care provider. Document Released: 11/10/2005 Document Revised: 05/30/2016 Document Reviewed: 04/28/2016 Elsevier Interactive Patient Education  2017 [employer]

## 2017-04-30 NOTE — Telephone Encounter (Signed)
The MRI brain & MRA Neck was approved but not the MRA Head wo contrast. They will not release the authorization number due to they are all linked together.. The phone number for the peer to peer is [phone] and the member ID is [id number] & DOB is 1966-09-25. The case close's on Monday 05/04/17.

## 2017-05-01 LAB — LIPID PANEL
CHOLESTEROL TOTAL: 196 mg/dL (ref 100–199)
Chol/HDL Ratio: 2.9 ratio (ref 0.0–4.4)
HDL: 68 mg/dL (ref >39)
LDL Calculated: 110 mg/dL — ABNORMAL HIGH (ref 0–99)
Triglycerides: 88 mg/dL (ref 0–149)
VLDL CHOLESTEROL CAL: 18 mg/dL (ref 5–40)

## 2017-05-01 LAB — COMPREHENSIVE METABOLIC PANEL
A/G RATIO: 1.8 (ref 1.2–2.2)
ALK PHOS: 40 IU/L (ref 39–117)
ALT: 12 IU/L (ref 0–32)
AST: 16 IU/L (ref 0–40)
Albumin: 4.3 g/dL (ref 3.5–5.5)
BUN/Creatinine Ratio: 20 (ref 9–23)
BUN: 16 mg/dL (ref 6–24)
Bilirubin Total: 0.5 mg/dL (ref 0.0–1.2)
CO2: 22 mmol/L (ref 18–29)
Calcium: 9.6 mg/dL (ref 8.7–10.2)
Chloride: 103 mmol/L (ref 96–106)
Creatinine, Ser: 0.81 mg/dL (ref 0.57–1.00)
GFR calc Af Amer: 99 mL/min/{1.73_m2} (ref >59)
GFR calc non Af Amer: 86 mL/min/{1.73_m2} (ref >59)
GLOBULIN, TOTAL: 2.4 g/dL (ref 1.5–4.5)
Glucose: 84 mg/dL (ref 65–99)
POTASSIUM: 4.9 mmol/L (ref 3.5–5.2)
SODIUM: 138 mmol/L (ref 134–144)
Total Protein: 6.7 g/dL (ref 6.0–8.5)

## 2017-05-01 LAB — CBC
Hematocrit: 40.5 % (ref 34.0–46.6)
Hemoglobin: 13.2 g/dL (ref 11.1–15.9)
MCH: 28.8 pg (ref 26.6–33.0)
MCHC: 32.6 g/dL (ref 31.5–35.7)
MCV: 88 fL (ref 79–97)
PLATELETS: 286 10*3/uL (ref 150–379)
RBC: 4.59 x10E6/uL (ref 3.77–5.28)
RDW: 13.6 % (ref 12.3–15.4)
WBC: 5.4 10*3/uL (ref 3.4–10.8)

## 2017-05-01 LAB — HEMOGLOBIN A1C
ESTIMATED AVERAGE GLUCOSE: 100 mg/dL
Hgb A1c MFr Bld: 5.1 % (ref 4.8–5.6)

## 2017-05-04 NOTE — Telephone Encounter (Signed)
Noted thank you

## 2017-05-04 NOTE — Telephone Encounter (Signed)
All approved. 6/7-7/6 [id number]. Make sure to include the patient's insurance co. In the message too thanks!

## 2017-05-20 ENCOUNTER — Ambulatory Visit
Admission: RE | Admit: 2017-05-20 | Discharge: 2017-05-20 | Disposition: A | Discharge status: Home / Self Care | Payer: BC Managed Care – PPO | Source: Ambulatory Visit | Attending: Neurology | Admitting: Neurology

## 2017-05-20 DIAGNOSIS — I6349 Cerebral infarction due to embolism of other cerebral artery: Secondary | ICD-10-CM

## 2017-05-20 DIAGNOSIS — H539 Unspecified visual disturbance: Secondary | ICD-10-CM

## 2017-05-20 DIAGNOSIS — H547 Unspecified visual loss: Secondary | ICD-10-CM

## 2017-05-20 DIAGNOSIS — I670 Dissection of cerebral arteries, nonruptured: Secondary | ICD-10-CM

## 2017-05-20 DIAGNOSIS — I672 Cerebral atherosclerosis: Secondary | ICD-10-CM

## 2017-05-20 DIAGNOSIS — I639 Cerebral infarction, unspecified: Secondary | ICD-10-CM

## 2017-05-20 MED ORDER — GADOBENATE DIMEGLUMINE 529 MG/ML IV SOLN
13.0000 mL | Freq: Once | INTRAVENOUS | Status: AC | PRN
  Administered 2017-05-20: 13 mL via INTRAVENOUS

## 2017-05-29 ENCOUNTER — Telehealth: Payer: Self-pay

## 2017-05-29 NOTE — Telephone Encounter (Signed)
Called pt w/ normal results. May call back w/ additional questions/concerns.

## 2017-05-29 NOTE — Telephone Encounter (Signed)
-----   Message from Anson FretAntonia B Ahern, MD sent at 05/28/2017 10:50 AM EDT ----- MRI brain, MRA of the head and neck all normal thanks

## 2018-05-19 ENCOUNTER — Other Ambulatory Visit: Payer: Self-pay | Admitting: Internal Medicine

## 2018-05-19 DIAGNOSIS — Z1231 Encounter for screening mammogram for malignant neoplasm of breast: Secondary | ICD-10-CM

## 2018-05-19 DIAGNOSIS — E2839 Other primary ovarian failure: Secondary | ICD-10-CM

## 2018-07-01 ENCOUNTER — Ambulatory Visit
Admission: RE | Admit: 2018-07-01 | Discharge: 2018-07-01 | Disposition: A | Discharge status: Home / Self Care | Payer: BC Managed Care – PPO | Source: Ambulatory Visit | Attending: Internal Medicine | Admitting: Internal Medicine

## 2018-07-01 DIAGNOSIS — E2839 Other primary ovarian failure: Secondary | ICD-10-CM

## 2018-07-01 DIAGNOSIS — Z1231 Encounter for screening mammogram for malignant neoplasm of breast: Secondary | ICD-10-CM

## 2018-07-08 ENCOUNTER — Other Ambulatory Visit (INDEPENDENT_AMBULATORY_CARE_PROVIDER_SITE_OTHER): Payer: Self-pay | Admitting: Otolaryngology

## 2018-07-08 DIAGNOSIS — J329 Chronic sinusitis, unspecified: Secondary | ICD-10-CM

## 2019-08-08 ENCOUNTER — Other Ambulatory Visit: Payer: Self-pay | Admitting: Internal Medicine

## 2019-08-08 DIAGNOSIS — Z1231 Encounter for screening mammogram for malignant neoplasm of breast: Secondary | ICD-10-CM

## 2019-10-31 ENCOUNTER — Ambulatory Visit
Admission: RE | Admit: 2019-10-31 | Discharge: 2019-10-31 | Disposition: A | Discharge status: Home / Self Care | Payer: BC Managed Care – PPO | Source: Ambulatory Visit | Attending: Internal Medicine | Admitting: Internal Medicine

## 2019-10-31 ENCOUNTER — Other Ambulatory Visit: Payer: Self-pay

## 2019-10-31 DIAGNOSIS — Z1231 Encounter for screening mammogram for malignant neoplasm of breast: Secondary | ICD-10-CM

## 2020-02-23 ENCOUNTER — Other Ambulatory Visit: Payer: Self-pay

## 2020-02-27 ENCOUNTER — Ambulatory Visit: Payer: BC Managed Care – PPO | Admitting: Obstetrics & Gynecology

## 2020-02-27 ENCOUNTER — Other Ambulatory Visit: Payer: Self-pay

## 2020-02-27 ENCOUNTER — Encounter: Payer: Self-pay | Admitting: Obstetrics & Gynecology

## 2020-02-27 VITALS — BP 120/70 | Ht 66.75 in | Wt 137.6 lb

## 2020-02-27 DIAGNOSIS — Z30011 Encounter for initial prescription of contraceptive pills: Secondary | ICD-10-CM | POA: Diagnosis not present

## 2020-02-27 DIAGNOSIS — Z780 Asymptomatic menopausal state: Secondary | ICD-10-CM | POA: Diagnosis not present

## 2020-02-27 DIAGNOSIS — Z01419 Encounter for gynecological examination (general) (routine) without abnormal findings: Secondary | ICD-10-CM

## 2020-02-27 MED ORDER — NORETHINDRONE 0.35 MG PO TABS: 1.0000 | ORAL_TABLET | Freq: Every day | ORAL | 4 refills | Status: DC

## 2020-02-27 NOTE — Progress Notes (Signed)
Renee Buckley 06/15/1966 [id number]   History:    54 y.o. G0 Married  RP:  Established patient presenting for annual gyn exam   HPI: No menses x about 6 months.  Having night sweats mainly.  No pelvic pain.  No pain with IC.  Not using contraception x 3 yrs.  Urine/BMs normal.  Breasts normal.  BMI 21.71.  Very fit.  Good nutrition.  Colono 2021.  Health labs Fam MD.  BD 2019 Normal.  Past medical history,surgical history, family history and social history were all reviewed and documented in the EPIC chart.  Gynecologic History Patient's last menstrual period was 02/27/2019.   Obstetric History OB History  Gravida Para Term Preterm AB Living  0 0 0 0 0 0  SAB TAB Ectopic Multiple Live Births  0 0 0 0 0     ROS: A ROS was performed and pertinent positives and negatives are included in the history.  GENERAL: No fevers or chills. HEENT: No change in vision, no earache, sore throat or sinus congestion. NECK: No pain or stiffness. CARDIOVASCULAR: No chest pain or pressure. No palpitations. PULMONARY: No shortness of breath, cough or wheeze. GASTROINTESTINAL: No abdominal pain, nausea, vomiting or diarrhea, melena or bright red blood per rectum. GENITOURINARY: No urinary frequency, urgency, hesitancy or dysuria. MUSCULOSKELETAL: No joint or muscle pain, no back pain, no recent trauma. DERMATOLOGIC: No rash, no itching, no lesions. ENDOCRINE: No polyuria, polydipsia, no heat or cold intolerance. No recent change in weight. HEMATOLOGICAL: No anemia or easy bruising or bleeding. NEUROLOGIC: No headache, seizures, numbness, tingling or weakness. PSYCHIATRIC: No depression, no loss of interest in normal activity or change in sleep pattern.     Exam:   Ht 5' 6.75" (1.695 m)   Wt 137 lb 9.6 oz (62.4 kg)   LMP 02/27/2019   BMI 21.71 kg/m   Body mass index is 21.71 kg/m.  General appearance : Well developed well nourished female. No acute distress HEENT: Eyes: no retinal hemorrhage or  exudates,  Neck supple, trachea midline, no carotid bruits, no thyroidmegaly Lungs: Clear to auscultation, no rhonchi or wheezes, or rib retractions  Heart: Regular rate and rhythm, no murmurs or gallops Breast:Examined in sitting and supine position were symmetrical in appearance, no palpable masses or tenderness,  no skin retraction, no nipple inversion, no nipple discharge, no skin discoloration, no axillary or supraclavicular lymphadenopathy Abdomen: no palpable masses or tenderness, no rebound or guarding Extremities: no edema or skin discoloration or tenderness  Pelvic: Vulva: Normal             Vagina: No gross lesions or discharge  Cervix: No gross lesions or discharge.  Pap reflex done.  Uterus  AV, normal size, shape and consistency, non-tender and mobile  Adnexa  Without masses or tenderness  Anus: External hemorrhoids, non-thrombosed.   Assessment/Plan:  54 y.o. female for annual exam   1. Encounter for routine gynecological examination with Papanicolaou smear of cervix Normal gynecologic exam.  Pap reflex done.  Breast exam normal.  Screening mammogram December 2020 was negative.  Health labs with family physician.  Very good body mass index at 21.71.  Continue with fitness and healthy nutrition.  Colono 2021. - Pap IG w/ reflex to HPV when ASC-U  2. Menopause present Perimenopause, probable entering menopause with no menses x 6 months.  Recommendation to use contraception for 2 years into menopause.  Will start oral norethindrone 0.35 mg daily.  Usage reviewed and prescription sent to  pharmacy. Will also protect the endometrium and may improve the hot flushes slightly.   3. Encounter for initial prescription of contraceptive pills Decision to start on the progestin only pill with norethindrone 0.35 mg daily.  Prescription sent to pharmacy.  Other orders - predniSONE (DELTASONE) 10 MG tablet; Take 10 mg by mouth daily with breakfast. - norethindrone (MICRONOR) 0.35 MG  tablet; Take 1 tablet (0.35 mg total) by mouth daily.  Princess Bruins MD, 4:17 PM 02/27/2020

## 2020-02-29 LAB — PAP IG W/ RFLX HPV ASCU

## 2020-03-05 ENCOUNTER — Encounter: Payer: Self-pay | Admitting: Obstetrics & Gynecology

## 2020-03-05 NOTE — Patient Instructions (Signed)
1. Encounter for routine gynecological examination with Papanicolaou smear of cervix Normal gynecologic exam.  Pap reflex done.  Breast exam normal.  Screening mammogram December 2020 was negative.  Health labs with family physician.  Very good body mass index at 21.71.  Continue with fitness and healthy nutrition. - Pap IG w/ reflex to HPV when ASC-U  2. Menopause present Perimenopause, probable entering menopause with no menses x 6 months.  Recommendation to use contraception for 2 years into menopause.  Will start oral norethindrone 0.35 mg daily.  Usage reviewed and prescription sent to pharmacy. Will also protect the endometrium and may improve the hot flushes slightly.   3. Encounter for initial prescription of contraceptive pills Decision to start on the progestin only pill with norethindrone 0.35 mg daily.  Prescription sent to pharmacy.  Other orders - predniSONE (DELTASONE) 10 MG tablet; Take 10 mg by mouth daily with breakfast. - norethindrone (MICRONOR) 0.35 MG tablet; Take 1 tablet (0.35 mg total) by mouth daily.  Renee Buckley, it was a pleasure seeing you today!  I will inform you of your results as soon as they are available.

## 2020-06-26 ENCOUNTER — Other Ambulatory Visit: Payer: Self-pay | Admitting: Internal Medicine

## 2020-06-26 DIAGNOSIS — R0989 Other specified symptoms and signs involving the circulatory and respiratory systems: Secondary | ICD-10-CM

## 2020-06-26 DIAGNOSIS — R109 Unspecified abdominal pain: Secondary | ICD-10-CM

## 2020-07-06 ENCOUNTER — Ambulatory Visit
Admission: RE | Admit: 2020-07-06 | Discharge: 2020-07-06 | Disposition: A | Discharge status: Home / Self Care | Payer: BC Managed Care – PPO | Source: Ambulatory Visit | Attending: Internal Medicine | Admitting: Internal Medicine

## 2020-07-06 DIAGNOSIS — R109 Unspecified abdominal pain: Secondary | ICD-10-CM

## 2020-07-06 DIAGNOSIS — R0989 Other specified symptoms and signs involving the circulatory and respiratory systems: Secondary | ICD-10-CM

## 2021-01-31 ENCOUNTER — Other Ambulatory Visit: Payer: Self-pay | Admitting: Internal Medicine

## 2021-01-31 DIAGNOSIS — Z1231 Encounter for screening mammogram for malignant neoplasm of breast: Secondary | ICD-10-CM

## 2021-02-27 ENCOUNTER — Encounter: Payer: BC Managed Care – PPO | Admitting: Obstetrics & Gynecology

## 2021-03-27 ENCOUNTER — Ambulatory Visit: Payer: BC Managed Care – PPO

## 2021-04-18 ENCOUNTER — Other Ambulatory Visit: Payer: Self-pay

## 2021-04-18 ENCOUNTER — Ambulatory Visit
Admission: RE | Admit: 2021-04-18 | Discharge: 2021-04-18 | Disposition: A | Discharge status: Home / Self Care | Payer: No Typology Code available for payment source | Source: Ambulatory Visit | Attending: Internal Medicine | Admitting: Internal Medicine

## 2021-04-18 DIAGNOSIS — Z1231 Encounter for screening mammogram for malignant neoplasm of breast: Secondary | ICD-10-CM

## 2021-05-07 ENCOUNTER — Ambulatory Visit (INDEPENDENT_AMBULATORY_CARE_PROVIDER_SITE_OTHER): Payer: No Typology Code available for payment source | Admitting: Obstetrics & Gynecology

## 2021-05-07 ENCOUNTER — Other Ambulatory Visit: Payer: Self-pay

## 2021-05-07 ENCOUNTER — Encounter: Payer: Self-pay | Admitting: Obstetrics & Gynecology

## 2021-05-07 VITALS — BP 130/72 | Ht 66.5 in | Wt 135.0 lb

## 2021-05-07 DIAGNOSIS — Z01419 Encounter for gynecological examination (general) (routine) without abnormal findings: Secondary | ICD-10-CM | POA: Diagnosis not present

## 2021-05-07 DIAGNOSIS — Z780 Asymptomatic menopausal state: Secondary | ICD-10-CM

## 2021-05-07 NOTE — Progress Notes (Signed)
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   RP:  Established patient presenting for annual gyn exam   HPI: Postmenopause, well on no HRT.  Hot flushes/night sweats improving, tolerable.  No pelvic pain.  No pain with IC.  Urine/BMs normal.  Breasts normal.  BMI 21.46.  Very fit.  Good nutrition.  Colono 2021.  Health labs Fam MD.  BD 2019 Normal.   Past medical history,surgical history, family history and social history were all reviewed and documented in the EPIC chart.  Gynecologic History Patient's last menstrual period was 11/25/2019 (approximate).  Obstetric History OB History  Gravida Para Term Preterm AB Living  0 0 0 0 0 0  SAB IAB Ectopic Multiple Live Births  0 0 0 0 0     ROS: A ROS was performed and pertinent positives and negatives are included in the history.  GENERAL: No fevers or chills. HEENT: No change in vision, no earache, sore throat or sinus congestion. NECK: No pain or stiffness. CARDIOVASCULAR: No chest pain or pressure. No palpitations. PULMONARY: No shortness of breath, cough or wheeze. GASTROINTESTINAL: No abdominal pain, nausea, vomiting or diarrhea, melena or bright red blood per rectum. GENITOURINARY: No urinary frequency, urgency, hesitancy or dysuria. MUSCULOSKELETAL: No joint or muscle pain, no back pain, no recent trauma. DERMATOLOGIC: No rash, no itching, no lesions. ENDOCRINE: No polyuria, polydipsia, no heat or cold intolerance. No recent change in weight. HEMATOLOGICAL: No anemia or easy bruising or bleeding. NEUROLOGIC: No headache, seizures, numbness, tingling or weakness. PSYCHIATRIC: No depression, no loss of interest in normal activity or change in sleep pattern.     Exam:   BP 130/72   Ht 5' 6.5" (1.689 m)   Wt 135 lb (61.2 kg)   LMP 11/25/2019 (Approximate)   BMI 21.46 kg/m   Body mass index is 21.46 kg/m.  General appearance : Well developed well nourished female. No acute distress HEENT:  Eyes: no retinal hemorrhage or exudates,  Neck supple, trachea midline, no carotid bruits, no thyroidmegaly Lungs: Clear to auscultation, no rhonchi or wheezes, or rib retractions  Heart: Regular rate and rhythm, no murmurs or gallops Breast:Examined in sitting and supine position were symmetrical in appearance, no palpable masses or tenderness,  no skin retraction, no nipple inversion, no nipple discharge, no skin discoloration, no axillary or supraclavicular lymphadenopathy Abdomen: no palpable masses or tenderness, no rebound or guarding Extremities: no edema or skin discoloration or tenderness  Pelvic: Vulva: Normal             Vagina: No gross lesions or discharge  Cervix: No gross lesions or discharge  Uterus AV, normal size, shape and consistency, non-tender and mobile  Adnexa  Without masses or tenderness  Anus: Normal   Assessment/Plan:  55 y.o. female for annual exam   1. Well female exam with routine gynecological exam Normal gynecologic exam in menopause.  No indication for Pap test this year, last Pap test April 2021 was negative.  Breast exam normal.  Screening mammogram May 2022 was negative.  Colonoscopy in 2021.  Good body mass and asked at 21.46.  Continue with fitness and healthy nutrition.  Health labs with family physician.  2. Postmenopause Well on no hormone replacement therapy.  No postmenopausal bleeding.  Recommend vitamin D supplements, calcium intake of 1.5 g/day total and regular weightbearing physical activities.  Other orders - fluticasone (FLONASE) 50 MCG/ACT nasal spray; Place 1 spray into both  nostrils daily. - levocetirizine (XYZAL) 5 MG tablet; SMARTSIG:1 Tablet(s) By Mouth Every Evening   Genia Del MD, 1:37 PM 05/07/2021

## 2021-05-09 ENCOUNTER — Encounter: Payer: Self-pay | Admitting: Obstetrics & Gynecology

## 2022-06-25 ENCOUNTER — Other Ambulatory Visit: Payer: Self-pay | Admitting: Internal Medicine

## 2022-06-25 DIAGNOSIS — Z1231 Encounter for screening mammogram for malignant neoplasm of breast: Secondary | ICD-10-CM

## 2022-07-15 ENCOUNTER — Ambulatory Visit
Admission: RE | Admit: 2022-07-15 | Discharge: 2022-07-15 | Disposition: A | Discharge status: Home / Self Care | Payer: No Typology Code available for payment source | Source: Ambulatory Visit | Attending: Internal Medicine | Admitting: Internal Medicine

## 2022-07-15 DIAGNOSIS — Z1231 Encounter for screening mammogram for malignant neoplasm of breast: Secondary | ICD-10-CM

## 2022-07-21 ENCOUNTER — Encounter: Payer: Self-pay | Admitting: Obstetrics & Gynecology

## 2022-07-21 ENCOUNTER — Other Ambulatory Visit (HOSPITAL_COMMUNITY)
Admission: RE | Admit: 2022-07-21 | Discharge: 2022-07-21 | Disposition: A | Discharge status: Home / Self Care | Payer: No Typology Code available for payment source | Source: Ambulatory Visit | Attending: Obstetrics & Gynecology | Admitting: Obstetrics & Gynecology

## 2022-07-21 ENCOUNTER — Ambulatory Visit (INDEPENDENT_AMBULATORY_CARE_PROVIDER_SITE_OTHER): Payer: No Typology Code available for payment source | Admitting: Obstetrics & Gynecology

## 2022-07-21 VITALS — BP 122/78 | Ht 66.0 in | Wt 132.0 lb

## 2022-07-21 DIAGNOSIS — Z01419 Encounter for gynecological examination (general) (routine) without abnormal findings: Secondary | ICD-10-CM | POA: Diagnosis not present

## 2022-07-21 DIAGNOSIS — Z780 Asymptomatic menopausal state: Secondary | ICD-10-CM

## 2022-07-21 NOTE — Progress Notes (Signed)
Renee Buckley July 06, 1966 [id number]   History:    56 y.o. G0 Married.  Builder.   RP:  Established patient presenting for annual gyn exam   HPI: Postmenopause, well on no HRT.  Hot flushes/night sweats improving, tolerable.  No pelvic pain.  No pain with IC.  Pap Neg 02/2020. Pap reflex today. Urine/BMs normal.  Breasts normal. Mammo Neg 06/2022.  BMI 21.31.  Very fit.  Good nutrition.  Colono 2021.  Health labs Fam MD.  BD 2019 Normal.   Past medical history,surgical history, family history and social history were all reviewed and documented in the EPIC chart.  Gynecologic History Patient's last menstrual period was 11/25/2019 (approximate).  Obstetric History OB History  Gravida Para Term Preterm AB Living  0 0 0 0 0 0  SAB IAB Ectopic Multiple Live Births  0 0 0 0 0     ROS: A ROS was performed and pertinent positives and negatives are included in the history. GENERAL: No fevers or chills. HEENT: No change in vision, no earache, sore throat or sinus congestion. NECK: No pain or stiffness. CARDIOVASCULAR: No chest pain or pressure. No palpitations. PULMONARY: No shortness of breath, cough or wheeze. GASTROINTESTINAL: No abdominal pain, nausea, vomiting or diarrhea, melena or bright red blood per rectum. GENITOURINARY: No urinary frequency, urgency, hesitancy or dysuria. MUSCULOSKELETAL: No joint or muscle pain, no back pain, no recent trauma. DERMATOLOGIC: No rash, no itching, no lesions. ENDOCRINE: No polyuria, polydipsia, no heat or cold intolerance. No recent change in weight. HEMATOLOGICAL: No anemia or easy bruising or bleeding. NEUROLOGIC: No headache, seizures, numbness, tingling or weakness. PSYCHIATRIC: No depression, no loss of interest in normal activity or change in sleep pattern.     Exam:   BP 122/78 (BP Location: Right Arm, Patient Position: Sitting, Cuff Size: Normal)   Ht 5\' 6"  (1.676 m)   Wt 132 lb (59.9 kg)   LMP 11/25/2019 (Approximate)   BMI 21.31 kg/m    Body mass index is 21.31 kg/m.  General appearance : Well developed well nourished female. No acute distress HEENT: Eyes: no retinal hemorrhage or exudates,  Neck supple, trachea midline, no carotid bruits, no thyroidmegaly Lungs: Clear to auscultation, no rhonchi or wheezes, or rib retractions  Heart: Regular rate and rhythm, no murmurs or gallops Breast:Examined in sitting and supine position were symmetrical in appearance, no palpable masses or tenderness,  no skin retraction, no nipple inversion, no nipple discharge, no skin discoloration, no axillary or supraclavicular lymphadenopathy Abdomen: no palpable masses or tenderness, no rebound or guarding Extremities: no edema or skin discoloration or tenderness  Pelvic: Vulva: Normal             Vagina: No gross lesions or discharge  Cervix: No gross lesions or discharge. Pap reflex done.  Uterus  AV, normal size, shape and consistency, non-tender and mobile  Adnexa  Without masses or tenderness  Anus: Normal   Assessment/Plan:  56 y.o. female for annual exam   1. Encounter for routine gynecological examination with Papanicolaou smear of cervix Postmenopause, well on no HRT.  Hot flushes/night sweats improving, tolerable.  No pelvic pain.  No pain with IC.  Pap Neg 02/2020. Pap reflex today. Urine/BMs normal.  Breasts normal. Mammo Neg 06/2022.  BMI 21.31.  Very fit.  Good nutrition.  Colono 2021.  Health labs Fam MD.  BD 2019 Normal. - Cytology - PAP( [place])  2. Postmenopause  Postmenopause, well on no HRT.  Hot flushes/night sweats  improving, tolerable.  No pelvic pain.  No pain with IC.   Genia Del MD, 1:46 PM 07/21/2022

## 2022-07-24 LAB — CYTOLOGY - PAP: Diagnosis: NEGATIVE

## 2023-07-08 DIAGNOSIS — I10 Essential (primary) hypertension: Secondary | ICD-10-CM | POA: Diagnosis not present

## 2023-07-13 DIAGNOSIS — Z0000 Encounter for general adult medical examination without abnormal findings: Secondary | ICD-10-CM | POA: Diagnosis not present

## 2023-07-13 DIAGNOSIS — R82998 Other abnormal findings in urine: Secondary | ICD-10-CM | POA: Diagnosis not present

## 2023-07-13 DIAGNOSIS — Z1331 Encounter for screening for depression: Secondary | ICD-10-CM | POA: Diagnosis not present

## 2023-07-13 DIAGNOSIS — I10 Essential (primary) hypertension: Secondary | ICD-10-CM | POA: Diagnosis not present

## 2023-07-13 DIAGNOSIS — Z1339 Encounter for screening examination for other mental health and behavioral disorders: Secondary | ICD-10-CM | POA: Diagnosis not present

## 2023-08-10 DIAGNOSIS — I160 Hypertensive urgency: Secondary | ICD-10-CM | POA: Diagnosis not present

## 2023-08-10 DIAGNOSIS — I10 Essential (primary) hypertension: Secondary | ICD-10-CM | POA: Diagnosis not present

## 2023-09-01 DIAGNOSIS — I160 Hypertensive urgency: Secondary | ICD-10-CM | POA: Diagnosis not present

## 2023-09-28 DIAGNOSIS — I10 Essential (primary) hypertension: Secondary | ICD-10-CM | POA: Diagnosis not present

## 2023-09-28 DIAGNOSIS — Z23 Encounter for immunization: Secondary | ICD-10-CM | POA: Diagnosis not present

## 2023-09-28 DIAGNOSIS — I160 Hypertensive urgency: Secondary | ICD-10-CM | POA: Diagnosis not present

## 2023-10-15 DIAGNOSIS — I10 Essential (primary) hypertension: Secondary | ICD-10-CM | POA: Diagnosis not present

## 2023-10-20 DIAGNOSIS — I10 Essential (primary) hypertension: Secondary | ICD-10-CM | POA: Diagnosis not present

## 2024-02-15 ENCOUNTER — Other Ambulatory Visit: Payer: Self-pay | Admitting: Internal Medicine

## 2024-02-15 DIAGNOSIS — Z1231 Encounter for screening mammogram for malignant neoplasm of breast: Secondary | ICD-10-CM

## 2024-02-26 ENCOUNTER — Ambulatory Visit
Admission: RE | Admit: 2024-02-26 | Discharge: 2024-02-26 | Disposition: A | Discharge status: Home / Self Care | Source: Ambulatory Visit | Attending: Internal Medicine | Admitting: Internal Medicine

## 2024-02-26 DIAGNOSIS — Z1231 Encounter for screening mammogram for malignant neoplasm of breast: Secondary | ICD-10-CM
